# Patient Record
Sex: Female | Born: 1980 | Hispanic: No | Marital: Married | Smoking: Never smoker
Health system: Southern US, Community
[De-identification: ages and names within clinical notes are randomized; demographics above are authoritative.]

## PROBLEM LIST (undated history)

## (undated) ENCOUNTER — Inpatient Hospital Stay (HOSPITAL_COMMUNITY): Payer: Self-pay

---

## 2013-05-29 ENCOUNTER — Encounter (HOSPITAL_COMMUNITY): Payer: Self-pay | Admitting: *Deleted

## 2013-05-29 ENCOUNTER — Inpatient Hospital Stay (HOSPITAL_COMMUNITY)
Admission: AD | Admit: 2013-05-29 | Discharge: 2013-05-29 | Disposition: A | Discharge status: Home / Self Care | Payer: Self-pay | Source: Ambulatory Visit | Attending: Obstetrics & Gynecology | Admitting: Obstetrics & Gynecology

## 2013-05-29 DIAGNOSIS — R42 Dizziness and giddiness: Secondary | ICD-10-CM | POA: Insufficient documentation

## 2013-05-29 DIAGNOSIS — R002 Palpitations: Secondary | ICD-10-CM | POA: Insufficient documentation

## 2013-05-29 DIAGNOSIS — H538 Other visual disturbances: Secondary | ICD-10-CM | POA: Insufficient documentation

## 2013-05-29 DIAGNOSIS — O99891 Other specified diseases and conditions complicating pregnancy: Secondary | ICD-10-CM | POA: Insufficient documentation

## 2013-05-29 LAB — DIFFERENTIAL
Lymphs Abs: 3 10*3/uL (ref 0.7–4.0)
Monocytes Relative: 8 % (ref 3–12)
Neutro Abs: 5.6 10*3/uL (ref 1.7–7.7)
Neutrophils Relative %: 57 % (ref 43–77)

## 2013-05-29 LAB — CBC
Hemoglobin: 12.5 g/dL (ref 12.0–15.0)
MCHC: 33.8 g/dL (ref 30.0–36.0)
MCV: 85.6 fL (ref 78.0–100.0)
RBC: 4.32 MIL/uL (ref 3.87–5.11)

## 2013-05-29 LAB — TYPE AND SCREEN: Antibody Screen: NEGATIVE

## 2013-05-29 MED ORDER — PRENATAL VITAMINS 0.8 MG PO TABS: 1.0000 | ORAL_TABLET | Freq: Every day | ORAL | Status: DC

## 2013-05-29 NOTE — MAU Note (Signed)
Patient brought in by her brother with complaints of dizziness around 2 pm. Reports feeling good now. Has not received any prenatal care this pregnancy. Has been in  for a week. Was in [place] for a month and prior to that in [place].

## 2013-05-29 NOTE — MAU Provider Note (Signed)
  History    CSN: [id number] Arrival date and time: 05/29/13 2016  Chief Complaint  Patient presents with  . Dizziness   HPI Cynthia Decker is a 32 y.o. G3P2002 at [redacted]w[redacted]d by LMP brought in by her brother complaining of dizziness.   The episode lasted < 1 minute earlier this afternoon while standing. She had been standing for about 2 minutes and was talking with a group of people. It was associated with blurry vision and palpitations. She did not fall or lose consciousness. She denies any other episodes before or since. She has had a normal diet recently with adequate fluids, has no sick contacts, denies chronic medical conditions and takes no medications.   Denies fever, chills, HA, vision changes, abdominal pain, Cp/SOB, N/V/D/C, muscle aches, dysuria, urinary frequency, blood in urine or stool, and swelling. No VB, LOF, regular contractions. + GFM.   She is from [place] and speaks only [place], moved to [place] for about 1 month and moved to [place] to live with a friend about 1 week ago. She has received no prenatal care and denies any problems with her previous 2 pregnancies. She will remain in [place] for the duration of this pregnancy and is amenable to establishing care here.    OB History   Grav Para Term Preterm Abortions TAB SAB Ect Mult Living   3 2 2       2      History  Substance Use Topics  . Smoking status: Never Smoker   . Smokeless tobacco: Never Used  . Alcohol Use: No   Allergies: No Known Allergies  No prescriptions prior to admission   ROS As mentioned in HPI, otherwise full ROS is negative. Physical Exam   Blood pressure 105/65, pulse 97, temperature 98.8 F (37.1 C), temperature source Oral, resp. rate 20, height 5\' 3"  (1.6 m), weight 75.751 kg (167 lb), last menstrual period 09/24/2012, SpO2 100.00%.  Physical Exam  Constitutional: She is oriented to person, place, and time. She appears well-developed and well-nourished. No distress.  HENT:  Head:  Normocephalic and atraumatic.  Eyes: Conjunctivae and EOM are normal. No scleral icterus.  Neck: Normal range of motion. Neck supple.  Cardiovascular: Normal heart sounds and intact distal pulses.  Exam reveals no gallop.   No murmur heard. Respiratory: Effort normal and breath sounds normal. No respiratory distress. She has no rales.  GI: Soft. Bowel sounds are normal. There is no tenderness.  Musculoskeletal: Normal range of motion. She exhibits no edema.  Neurological: She is alert and oriented to person, place, and time. She has normal reflexes.   FHT:  Baseline 150, moderate variability, accelerations present, no decelerations Toco: No contractions    05/29/2013 21:40  WBC 9.8  RBC 4.32  Hemoglobin 12.5  HCT 37.0  MCV 85.6  MCH 28.9  MCHC 33.8  RDW 13.7  Platelets 150   MAU Course  Procedures  MDM Brief episode of dizziness without syncope. Exam not indicative of ongoing cardiac etiology. Neuro exam normal. No anemia on CBC. FHT reactive, category I.   Assessment and Plan  Cynthia Decker is a 32 y.o. G3P2002 at [redacted]w[redacted]d with an isolated episode of dizziness without syncope.  - Discharge home - To establish care at [facility], prenatal labs drawn - Rx prenatal vitamin  Cynthia Decker 05/29/2013, 9:39 PM

## 2013-05-30 ENCOUNTER — Other Ambulatory Visit: Payer: Self-pay | Admitting: Family

## 2013-05-30 DIAGNOSIS — O0930 Supervision of pregnancy with insufficient antenatal care, unspecified trimester: Secondary | ICD-10-CM

## 2013-05-30 LAB — RPR: RPR Ser Ql: NONREACTIVE

## 2013-05-30 NOTE — MAU Provider Note (Signed)
I examined pt and agree with documentation above and resident plan of care. MUHAMMAD,Dacotah Cabello  

## 2013-05-31 LAB — RUBELLA SCREEN: Rubella: 18 Index — ABNORMAL HIGH (ref <0.90)

## 2013-06-08 ENCOUNTER — Ambulatory Visit (INDEPENDENT_AMBULATORY_CARE_PROVIDER_SITE_OTHER): Payer: Self-pay | Admitting: Advanced Practice Midwife

## 2013-06-08 ENCOUNTER — Encounter: Payer: Self-pay | Admitting: Advanced Practice Midwife

## 2013-06-08 VITALS — BP 110/70 | Temp 98.7°F | Wt 164.5 lb

## 2013-06-08 DIAGNOSIS — Z609 Problem related to social environment, unspecified: Secondary | ICD-10-CM

## 2013-06-08 DIAGNOSIS — Z603 Acculturation difficulty: Secondary | ICD-10-CM | POA: Insufficient documentation

## 2013-06-08 DIAGNOSIS — O0930 Supervision of pregnancy with insufficient antenatal care, unspecified trimester: Secondary | ICD-10-CM

## 2013-06-08 DIAGNOSIS — O0933 Supervision of pregnancy with insufficient antenatal care, third trimester: Secondary | ICD-10-CM | POA: Insufficient documentation

## 2013-06-08 LAB — POCT URINALYSIS DIP (DEVICE)
Bilirubin Urine: NEGATIVE
Glucose, UA: NEGATIVE mg/dL
Hgb urine dipstick: NEGATIVE
Nitrite: NEGATIVE
Protein, ur: NEGATIVE mg/dL
Urobilinogen, UA: 0.2 mg/dL (ref 0.0–1.0)

## 2013-06-08 LAB — OB RESULTS CONSOLE GC/CHLAMYDIA
Chlamydia: NEGATIVE
Gonorrhea: NEGATIVE

## 2013-06-08 LAB — GLUCOSE TOLERANCE, 1 HOUR (50G) W/O FASTING: Glucose, 1 Hour GTT: 123 mg/dL (ref 70–140)

## 2013-06-08 NOTE — Progress Notes (Signed)
New OB. See other note  Subjective:    Cynthia Decker is a [id number] [redacted]w[redacted]d being seen today for her first obstetrical visit.  Her obstetrical history is significant for Late to care at 36.5wks, initally refused all testing due to cost, language barrier [place]). Patient does intend to breast feed. Pregnancy history fully reviewed.  Patient reports no complaints.  Filed Vitals:   06/08/13 0832  BP: 110/70  Temp: 98.7 F (37.1 C)  Weight: 164 lb 8 oz (74.617 kg)    HISTORY: OB History  Gravida Para Term Preterm AB SAB TAB Ectopic Multiple Living  3 2 2       2     # Outcome Date GA Lbr Len/2nd Weight Sex Delivery Anes PTL Lv  3 CUR           2 TRM 10/22/11 [redacted]w[redacted]d  6 lb 3 oz (2.807 kg) M SVD  N Y  1 TRM 04/2008 [redacted]w[redacted]d  6 lb 1 oz (2.75 kg) M SVD  N Y     Comments: Induced for Low Fluid     History reviewed. No pertinent past medical history. History reviewed. No pertinent past surgical history. History reviewed. No pertinent family history.   Exam    Uterus:  Fundal Height: 36 cm  Pelvic Exam:    Perineum: No Hemorrhoids   Vulva: Bartholin's, Urethra, Skene's normal   Vagina:  normal mucosa, normal discharge   pH:    Cervix: multiparous appearance   Adnexa: no mass, fullness, tenderness   Bony Pelvis: gynecoid  System: Breast:  normal appearance, no masses or tenderness   Skin: normal coloration and turgor, no rashes    Neurologic: oriented, grossly non-focal   Extremities: normal strength, tone, and muscle mass   HEENT neck supple with midline trachea   Mouth/Teeth mucous membranes moist, pharynx normal without lesions   Neck supple and no masses   Cardiovascular: regular rate and rhythm   Respiratory:  appears well, vitals normal, no respiratory distress, acyanotic, normal RR, ear and throat exam is normal, neck free of mass or lymphadenopathy, chest clear, no wheezing, crepitations, rhonchi, normal symmetric air entry   Abdomen: soft, non-tender; bowel sounds normal; no  masses,  no organomegaly   Urinary: urethral meatus normal      Assessment:    Pregnancy: [id number] Patient Active Problem List   Diagnosis Date Noted  . Late prenatal care complicating pregnancy in third trimester 06/08/2013        Plan:     Initial labs drawn. Prenatal vitamins. Problem list reviewed and updated. Genetic Screening discussed Quad Screen: Too Late.  Ultrasound discussed; fetal survey: declined.  Follow up in 1 weeks. 50% of 30 min visit spent on counseling and coordination of care.   Initially refused all testing due to cost. Only wants it if [employer] will pay for it. Came from [place] to [place] then here "for work".  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 States did not go through Immigration, had no vaccines or testing. Did have some blood testing in MAU. As she was leaving, stated she wanted to do the blood tests and vaginal cultures.  These were done after visit.    [facility] 06/08/2013

## 2013-06-08 NOTE — Patient Instructions (Signed)

## 2013-06-08 NOTE — Progress Notes (Signed)
Pulse: 106 Patient has had no prenatal care and has not had an ultrasound yet, based on her lmp she is [redacted]w[redacted]d. States that she didn't get prental care earlier because she was not stable.  She has adopt a mom and is concerned about cost and what the program covers.

## 2013-06-09 LAB — PRESCRIPTION MONITORING PROFILE (19 PANEL)
Amphetamine/Meth: NEGATIVE ng/mL
Barbiturate Screen, Urine: NEGATIVE ng/mL
Benzodiazepine Screen, Urine: NEGATIVE ng/mL
Buprenorphine, Urine: NEGATIVE ng/mL
Cannabinoid Scrn, Ur: NEGATIVE ng/mL
Carisoprodol, Urine: NEGATIVE ng/mL
Methadone Screen, Urine: NEGATIVE ng/mL
Methaqualone: NEGATIVE ng/mL
Opiate Screen, Urine: NEGATIVE ng/mL
Phencyclidine, Ur: NEGATIVE ng/mL
Tapentadol, urine: NEGATIVE ng/mL
Zolpidem, Urine: NEGATIVE ng/mL
pH, Initial: 6.9 pH (ref 4.5–8.9)

## 2013-06-09 LAB — CULTURE, BETA STREP (GROUP B ONLY)

## 2013-06-10 LAB — HEMOGLOBINOPATHY EVALUATION
Hemoglobin Other: 0 %
Hgb A2 Quant: 2.7 % (ref 2.2–3.2)
Hgb A: 96.1 % — ABNORMAL LOW (ref 96.8–97.8)
Hgb S Quant: 0 %

## 2013-06-11 LAB — CULTURE, OB URINE: Colony Count: >=100000

## 2013-06-11 LAB — GC/CHLAMYDIA PROBE AMP: GC Probe RNA: NEGATIVE

## 2013-06-15 ENCOUNTER — Ambulatory Visit (INDEPENDENT_AMBULATORY_CARE_PROVIDER_SITE_OTHER): Payer: Self-pay | Admitting: Family

## 2013-06-15 VITALS — BP 107/67 | Wt 164.9 lb

## 2013-06-15 DIAGNOSIS — O0933 Supervision of pregnancy with insufficient antenatal care, third trimester: Secondary | ICD-10-CM

## 2013-06-15 DIAGNOSIS — Z3490 Encounter for supervision of normal pregnancy, unspecified, unspecified trimester: Secondary | ICD-10-CM

## 2013-06-15 DIAGNOSIS — O0930 Supervision of pregnancy with insufficient antenatal care, unspecified trimester: Secondary | ICD-10-CM

## 2013-06-15 LAB — POCT URINALYSIS DIP (DEVICE)
Bilirubin Urine: NEGATIVE
Glucose, UA: NEGATIVE mg/dL
Hgb urine dipstick: NEGATIVE
Ketones, ur: NEGATIVE mg/dL
Specific Gravity, Urine: 1.02 (ref 1.005–1.030)
Urobilinogen, UA: 1 mg/dL (ref 0.0–1.0)

## 2013-06-15 LAB — OB RESULTS CONSOLE GBS: STREP GROUP B AG: NEGATIVE

## 2013-06-15 NOTE — Progress Notes (Signed)
Pulse:109 

## 2013-06-15 NOTE — Progress Notes (Signed)
Reviewed OB lab results; GBS collected.

## 2013-06-20 ENCOUNTER — Encounter: Payer: Self-pay | Admitting: Family

## 2013-06-21 ENCOUNTER — Ambulatory Visit (HOSPITAL_COMMUNITY)
Admission: RE | Admit: 2013-06-21 | Discharge: 2013-06-21 | Disposition: A | Discharge status: Home / Self Care | Payer: Self-pay | Source: Ambulatory Visit | Attending: Family | Admitting: Family

## 2013-06-21 DIAGNOSIS — O0930 Supervision of pregnancy with insufficient antenatal care, unspecified trimester: Secondary | ICD-10-CM | POA: Insufficient documentation

## 2013-06-21 DIAGNOSIS — Z3689 Encounter for other specified antenatal screening: Secondary | ICD-10-CM | POA: Insufficient documentation

## 2013-06-21 DIAGNOSIS — Z3490 Encounter for supervision of normal pregnancy, unspecified, unspecified trimester: Secondary | ICD-10-CM

## 2013-06-22 ENCOUNTER — Ambulatory Visit (INDEPENDENT_AMBULATORY_CARE_PROVIDER_SITE_OTHER): Payer: Self-pay | Admitting: Obstetrics & Gynecology

## 2013-06-22 ENCOUNTER — Encounter: Payer: Self-pay | Admitting: Obstetrics & Gynecology

## 2013-06-22 VITALS — BP 124/77 | Temp 97.5°F | Wt 166.2 lb

## 2013-06-22 DIAGNOSIS — Z609 Problem related to social environment, unspecified: Secondary | ICD-10-CM

## 2013-06-22 DIAGNOSIS — Z603 Acculturation difficulty: Secondary | ICD-10-CM

## 2013-06-22 DIAGNOSIS — O0930 Supervision of pregnancy with insufficient antenatal care, unspecified trimester: Secondary | ICD-10-CM

## 2013-06-22 DIAGNOSIS — O0933 Supervision of pregnancy with insufficient antenatal care, third trimester: Secondary | ICD-10-CM

## 2013-06-22 LAB — POCT URINALYSIS DIP (DEVICE)
Bilirubin Urine: NEGATIVE
Hgb urine dipstick: NEGATIVE
Ketones, ur: 40 mg/dL — AB
Nitrite: NEGATIVE
Protein, ur: NEGATIVE mg/dL
Specific Gravity, Urine: 1.025 (ref 1.005–1.030)
pH: 6.5 (ref 5.0–8.0)

## 2013-06-22 NOTE — Progress Notes (Signed)
Reviewed neg GBS Reviewed sono from 12/30 Labor precautions reviewed

## 2013-06-22 NOTE — Patient Instructions (Signed)
Breastfeeding Deciding to breastfeed is one of the best choices you can make for you and your baby. A change in hormones during pregnancy causes your breast tissue to grow and increases the number and size of your milk ducts. These hormones also allow proteins, sugars, and fats from your blood supply to make breast milk in your milk-producing glands. Hormones prevent breast milk from being released before your baby is born as well as prompt milk flow after birth. Once breastfeeding has begun, thoughts of your baby, as well as his or her sucking or crying, can stimulate the release of milk from your milk-producing glands.  BENEFITS OF BREASTFEEDING For Your Baby  Your first milk (colostrum) helps your baby's digestive system function better.   There are antibodies in your milk that help your baby fight off infections.   Your baby has a lower incidence of asthma, allergies, and sudden infant death syndrome.   The nutrients in breast milk are better for your baby than infant formulas and are designed uniquely for your baby's needs.   Breast milk improves your baby's brain development.   Your baby is less likely to develop other conditions, such as childhood obesity, asthma, or type 2 diabetes mellitus.  For You   Breastfeeding helps to create a very special bond between you and your baby.   Breastfeeding is convenient. Breast milk is always available at the correct temperature and costs nothing.   Breastfeeding helps to burn calories and helps you lose the weight gained during pregnancy.   Breastfeeding makes your uterus contract to its prepregnancy size faster and slows bleeding (lochia) after you give birth.   Breastfeeding helps to lower your risk of developing type 2 diabetes mellitus, osteoporosis, and breast or ovarian cancer later in life. SIGNS THAT YOUR BABY IS HUNGRY Early Signs of Hunger  Increased alertness or activity.  Stretching.  Movement of the head from  side to side.  Movement of the head and opening of the mouth when the corner of the mouth or cheek is stroked (rooting).  Increased sucking sounds, smacking lips, cooing, sighing, or squeaking.  Hand-to-mouth movements.  Increased sucking of fingers or hands. Late Signs of Hunger  Fussing.  Intermittent crying. Extreme Signs of Hunger Signs of extreme hunger will require calming and consoling before your baby will be able to breastfeed successfully. Do not wait for the following signs of extreme hunger to occur before you initiate breastfeeding:   Restlessness.  A loud, strong cry.   Screaming. BREASTFEEDING BASICS Breastfeeding Initiation  Find a comfortable place to sit or lie down, with your neck and back well supported.  Place a pillow or rolled up blanket under your baby to bring him or her to the level of your breast (if you are seated). Nursing pillows are specially designed to help support your arms and your baby while you breastfeed.  Make sure that your baby's abdomen is facing your abdomen.   Gently massage your breast. With your fingertips, massage from your chest wall toward your nipple in a circular motion. This encourages milk flow. You may need to continue this action during the feeding if your milk flows slowly.  Support your breast with 4 fingers underneath and your thumb above your nipple. Make sure your fingers are well away from your nipple and your baby's mouth.   Stroke your baby's lips gently with your finger or nipple.   When your baby's mouth is open wide enough, quickly bring your baby to your   breast, placing your entire nipple and as much of the colored area around your nipple (areola) as possible into your baby's mouth.   More areola should be visible above your baby's upper lip than below the lower lip.   Your baby's tongue should be between his or her lower gum and your breast.   Ensure that your baby's mouth is correctly positioned  around your nipple (latched). Your baby's lips should create a seal on your breast and be turned out (everted).  It is common for your baby to suck about 2 3 minutes in order to start the flow of breast milk. Latching Teaching your baby how to latch on to your breast properly is very important. An improper latch can cause nipple pain and decreased milk supply for you and poor weight gain in your baby. Also, if your baby is not latched onto your nipple properly, he or she may swallow some air during feeding. This can make your baby fussy. Burping your baby when you switch breasts during the feeding can help to get rid of the air. However, teaching your baby to latch on properly is still the best way to prevent fussiness from swallowing air while breastfeeding. Signs that your baby has successfully latched on to your nipple:    Silent tugging or silent sucking, without causing you pain.   Swallowing heard between every 3 4 sucks.    Muscle movement above and in front of his or her ears while sucking.  Signs that your baby has not successfully latched on to nipple:   Sucking sounds or smacking sounds from your baby while breastfeeding.  Nipple pain. If you think your baby has not latched on correctly, slip your finger into the corner of your baby's mouth to break the suction and place it between your baby's gums. Attempt breastfeeding initiation again. Signs of Successful Breastfeeding Signs from your baby:   A gradual decrease in the number of sucks or complete cessation of sucking.   Falling asleep.   Relaxation of his or her body.   Retention of a small amount of milk in his or her mouth.   Letting go of your breast by himself or herself. Signs from you:  Breasts that have increased in firmness, weight, and size 1 3 hours after feeding.   Breasts that are softer immediately after breastfeeding.  Increased milk volume, as well as a change in milk consistency and color by  the 5th day of breastfeeding.   Nipples that are not sore, cracked, or bleeding. Signs That Your Baby is Getting Enough Milk  Wetting at least 3 diapers in a 24-hour period. The urine should be clear and pale yellow by age 5 days.  At least 3 stools in a 24-hour period by age 5 days. The stool should be soft and yellow.  At least 3 stools in a 24-hour period by age 7 days. The stool should be seedy and yellow.  No loss of weight greater than 10% of birth weight during the first 3 days of age.  Average weight gain of 4 7 ounces (120 210 mL) per week after age 4 days.  Consistent daily weight gain by age 5 days, without weight loss after the age of 2 weeks. After a feeding, your baby may spit up a small amount. This is common. BREASTFEEDING FREQUENCY AND DURATION Frequent feeding will help you make more milk and can prevent sore nipples and breast engorgement. Breastfeed when you feel the need to reduce   the fullness of your breasts or when your baby shows signs of hunger. This is called "breastfeeding on demand." Avoid introducing a pacifier to your baby while you are working to establish breastfeeding (the first 4 6 weeks after your baby is born). After this time you may choose to use a pacifier. Research has shown that pacifier use during the first year of a baby's life decreases the risk of sudden infant death syndrome (SIDS). Allow your baby to feed on each breast as long as he or she wants. Breastfeed until your baby is finished feeding. When your baby unlatches or falls asleep while feeding from the first breast, offer the second breast. Because newborns are often sleepy in the first few weeks of life, you may need to awaken your baby to get him or her to feed. Breastfeeding times will vary from baby to baby. However, the following rules can serve as a guide to help you ensure that your baby is properly fed:  Newborns (babies 4 weeks of age or younger) may breastfeed every 1 3  hours.  Newborns should not go longer than 3 hours during the day or 5 hours during the night without breastfeeding.  You should breastfeed your baby a minimum of 8 times in a 24-hour period until you begin to introduce solid foods to your baby at around 6 months of age. BREAST MILK PUMPING Pumping and storing breast milk allows you to ensure that your baby is exclusively fed your breast milk, even at times when you are unable to breastfeed. This is especially important if you are going back to work while you are still breastfeeding or when you are not able to be present during feedings. Your lactation consultant can give you guidelines on how long it is safe to store breast milk.  A breast pump is a machine that allows you to pump milk from your breast into a sterile bottle. The pumped breast milk can then be stored in a refrigerator or freezer. Some breast pumps are operated by hand, while others use electricity. Ask your lactation consultant which type will work best for you. Breast pumps can be purchased, but some hospitals and breastfeeding support groups lease breast pumps on a monthly basis. A lactation consultant can teach you how to hand express breast milk, if you prefer not to use a pump.  CARING FOR YOUR BREASTS WHILE YOU BREASTFEED Nipples can become dry, cracked, and sore while breastfeeding. The following recommendations can help keep your breasts moisturized and healthy:  Avoid using soap on your nipples.   Wear a supportive bra. Although not required, special nursing bras and tank tops are designed to allow access to your breasts for breastfeeding without taking off your entire bra or top. Avoid wearing underwire style bras or extremely tight bras.  Air dry your nipples for 3 4minutes after each feeding.   Use only cotton bra pads to absorb leaked breast milk. Leaking of breast milk between feedings is normal.   Use lanolin on your nipples after breastfeeding. Lanolin helps to  maintain your skin's normal moisture barrier. If you use pure lanolin you do not need to wash it off before feeding your baby again. Pure lanolin is not toxic to your baby. You may also hand express a few drops of breast milk and gently massage that milk into your nipples and allow the milk to air dry. In the first few weeks after giving birth, some women experience extremely full breasts (engorgement). Engorgement can make   your breasts feel heavy, warm, and tender to the touch. Engorgement peaks within 3 5 days after you give birth. The following recommendations can help ease engorgement:  Completely empty your breasts while breastfeeding or pumping. You may want to start by applying warm, moist heat (in the shower or with warm water-soaked hand towels) just before feeding or pumping. This increases circulation and helps the milk flow. If your baby does not completely empty your breasts while breastfeeding, pump any extra milk after he or she is finished.  Wear a snug bra (nursing or regular) or tank top for 1 2 days to signal your body to slightly decrease milk production.  Apply ice packs to your breasts, unless this is too uncomfortable for you.  Make sure that your baby is latched on and positioned properly while breastfeeding. If engorgement persists after 48 hours of following these recommendations, contact your health care provider or a lactation consultant. OVERALL HEALTH CARE RECOMMENDATIONS WHILE BREASTFEEDING  Eat healthy foods. Alternate between meals and snacks, eating 3 of each per day. Because what you eat affects your breast milk, some of the foods may make your baby more irritable than usual. Avoid eating these foods if you are sure that they are negatively affecting your baby.  Drink milk, fruit juice, and water to satisfy your thirst (about 10 glasses a day).   Rest often, relax, and continue to take your prenatal vitamins to prevent fatigue, stress, and anemia.  Continue  breast self-awareness checks.  Avoid chewing and smoking tobacco.  Avoid alcohol and drug use. Some medicines that may be harmful to your baby can pass through breast milk. It is important to ask your health care provider before taking any medicine, including all over-the-counter and prescription medicine as well as vitamin and herbal supplements. It is possible to become pregnant while breastfeeding. If birth control is desired, ask your health care provider about options that will be safe for your baby. SEEK MEDICAL CARE IF:   You feel like you want to stop breastfeeding or have become frustrated with breastfeeding.  You have painful breasts or nipples.  Your nipples are cracked or bleeding.  Your breasts are red, tender, or warm.  You have a swollen area on either breast.  You have a fever or chills.  You have nausea or vomiting.  You have drainage other than breast milk from your nipples.  Your breasts do not become full before feedings by the 5th day after you give birth.  You feel sad and depressed.  Your baby is too sleepy to eat well.  Your baby is having trouble sleeping.   Your baby is wetting less than 3 diapers in a 24-hour period.  Your baby has less than 3 stools in a 24-hour period.  Your baby's skin or the white part of his or her eyes becomes yellow.   Your baby is not gaining weight by 5 days of age. SEEK IMMEDIATE MEDICAL CARE IF:   Your baby is overly tired (lethargic) and does not want to wake up and feed.  Your baby develops an unexplained fever. Document Released: 06/09/2005 Document Revised: 02/09/2013 Document Reviewed: 12/01/2012 Exit[employer] Patient Information 2014 [employer].  

## 2013-06-22 NOTE — Progress Notes (Signed)
Pulse: 103

## 2013-06-23 ENCOUNTER — Encounter: Payer: Self-pay | Admitting: Family

## 2013-06-23 NOTE — L&D Delivery Note (Signed)
Attestation of Attending Supervision of Advanced Practitioner (PA/CNM/NP): Evaluation and management procedures were performed by the Advanced Practitioner under my supervision and collaboration.  I have reviewed the Advanced Practitioner's note and chart, and I agree with the management and plan.  Kareemah Grounds, MD, FACOG Attending Obstetrician & Gynecologist Faculty Practice, [facility]  

## 2013-06-23 NOTE — L&D Delivery Note (Signed)
Delivery Note Pt pushed for a short time and at 1:29 AM a viable female was delivered via Vaginal, Spontaneous Delivery (Presentation: Occiput Anterior).  Infant born with amniotic sac intact- en caul. Membrane pulled away from face immediately after birth.  APGAR: 8, 9; weight: 6+12.8. Infant dried and placed on pt's abd; cord clamped and cut by mother of pt. Hospital cord blood sample collected.  Placenta status: intact, spontaneous.  Cord: 3 vessels with the following complications: None.  Cord pH: not sent  Anesthesia: Epidural  Episiotomy: None Lacerations: None Suture Repair: None Est. Blood Loss (mL): 200  Mom to postpartum.  Baby to Couplet care / Skin to Skin.  Cynthia Decker, Cynthia Decker 07/09/2013, 1:44 AM  I have seen and examined this patient and I agree with the above. SHAW, KIMBERLY 3:09 AM 07/09/2013

## 2013-06-28 ENCOUNTER — Ambulatory Visit (INDEPENDENT_AMBULATORY_CARE_PROVIDER_SITE_OTHER): Payer: Self-pay | Admitting: Advanced Practice Midwife

## 2013-06-28 VITALS — BP 107/71 | Temp 97.8°F | Wt 166.8 lb

## 2013-06-28 DIAGNOSIS — O0933 Supervision of pregnancy with insufficient antenatal care, third trimester: Secondary | ICD-10-CM

## 2013-06-28 DIAGNOSIS — Z23 Encounter for immunization: Secondary | ICD-10-CM

## 2013-06-28 DIAGNOSIS — O0930 Supervision of pregnancy with insufficient antenatal care, unspecified trimester: Secondary | ICD-10-CM

## 2013-06-28 LAB — POCT URINALYSIS DIP (DEVICE)
BILIRUBIN URINE: NEGATIVE
Glucose, UA: NEGATIVE mg/dL
Ketones, ur: NEGATIVE mg/dL
Leukocytes, UA: NEGATIVE
Nitrite: NEGATIVE
Protein, ur: NEGATIVE mg/dL
Specific Gravity, Urine: 1.025 (ref 1.005–1.030)
UROBILINOGEN UA: 1 mg/dL (ref 0.0–1.0)
pH: 6 (ref 5.0–8.0)

## 2013-06-28 MED ORDER — TETANUS-DIPHTH-ACELL PERTUSSIS 5-2.5-18.5 LF-MCG/0.5 IM SUSP: 0.5000 mL | Freq: Once | INTRAMUSCULAR | Status: DC

## 2013-06-28 NOTE — Progress Notes (Signed)
P=101  [employer] tradernterpreter,

## 2013-06-28 NOTE — Progress Notes (Signed)
Doing well.  Good fetal movement, denies vaginal bleeding, LOF, regular contractions.  Intermittent cramping keeping her awake at night.  Desires cervical exam.  Membranes swept at pt request.  Pt tolerated well.

## 2013-07-06 ENCOUNTER — Ambulatory Visit (INDEPENDENT_AMBULATORY_CARE_PROVIDER_SITE_OTHER): Payer: Self-pay | Admitting: Family

## 2013-07-06 ENCOUNTER — Encounter: Payer: Self-pay | Admitting: Family

## 2013-07-06 VITALS — BP 121/72 | Wt 168.2 lb

## 2013-07-06 DIAGNOSIS — O0930 Supervision of pregnancy with insufficient antenatal care, unspecified trimester: Secondary | ICD-10-CM

## 2013-07-06 DIAGNOSIS — O480 Post-term pregnancy: Secondary | ICD-10-CM

## 2013-07-06 DIAGNOSIS — O0933 Supervision of pregnancy with insufficient antenatal care, third trimester: Secondary | ICD-10-CM

## 2013-07-06 LAB — POCT URINALYSIS DIP (DEVICE)
Bilirubin Urine: NEGATIVE
Glucose, UA: NEGATIVE mg/dL
KETONES UR: NEGATIVE mg/dL
Nitrite: NEGATIVE
Protein, ur: NEGATIVE mg/dL
Specific Gravity, Urine: >=1.03 (ref 1.005–1.030)
Urobilinogen, UA: 1 mg/dL (ref 0.0–1.0)
pH: 6.5 (ref 5.0–8.0)

## 2013-07-06 LAB — US OB FOLLOW UP

## 2013-07-06 NOTE — Progress Notes (Signed)
Pulse 104 Patient states that she is having a lot of pain and contractions, also can't sleep.

## 2013-07-06 NOTE — Progress Notes (Signed)
I examined pt and agree with documentation above and nurse midwife student plan of care.  

## 2013-07-06 NOTE — Progress Notes (Signed)
Pt reports constant lower back pain and pelvic pressure. +FM, Denies LOF, VB. Schedule IOL

## 2013-07-07 ENCOUNTER — Telehealth (HOSPITAL_COMMUNITY): Payer: Self-pay | Admitting: *Deleted

## 2013-07-07 NOTE — Telephone Encounter (Signed)
Preadmission screen Interpreter number [phone]

## 2013-07-08 ENCOUNTER — Encounter (HOSPITAL_COMMUNITY): Payer: Self-pay

## 2013-07-08 ENCOUNTER — Encounter: Payer: Self-pay | Admitting: Advanced Practice Midwife

## 2013-07-08 ENCOUNTER — Inpatient Hospital Stay (HOSPITAL_COMMUNITY)
Admission: RE | Admit: 2013-07-08 | Discharge: 2013-07-10 | DRG: 775 | Disposition: A | Discharge status: Home / Self Care | Payer: Self-pay | Source: Ambulatory Visit | Attending: Obstetrics & Gynecology | Admitting: Obstetrics & Gynecology

## 2013-07-08 VITALS — BP 98/49 | HR 81 | Temp 98.3°F | Resp 17 | Ht 63.0 in | Wt 166.0 lb

## 2013-07-08 DIAGNOSIS — O480 Post-term pregnancy: Secondary | ICD-10-CM

## 2013-07-08 DIAGNOSIS — O0933 Supervision of pregnancy with insufficient antenatal care, third trimester: Secondary | ICD-10-CM

## 2013-07-08 DIAGNOSIS — IMO0001 Reserved for inherently not codable concepts without codable children: Secondary | ICD-10-CM

## 2013-07-08 LAB — CBC
HCT: 37.8 % (ref 36.0–46.0)
Hemoglobin: 12.7 g/dL (ref 12.0–15.0)
MCH: 29.2 pg (ref 26.0–34.0)
MCHC: 33.6 g/dL (ref 30.0–36.0)
MCV: 86.9 fL (ref 78.0–100.0)
PLATELETS: 152 10*3/uL (ref 150–400)
RBC: 4.35 MIL/uL (ref 3.87–5.11)
RDW: 14.7 % (ref 11.5–15.5)
WBC: 8.4 10*3/uL (ref 4.0–10.5)

## 2013-07-08 LAB — RPR: RPR Ser Ql: NONREACTIVE

## 2013-07-08 MED ORDER — EPHEDRINE 5 MG/ML INJ
10.0000 mg | INTRAVENOUS | Status: DC | PRN
  Filled 2013-07-08: qty 2

## 2013-07-08 MED ORDER — OXYTOCIN BOLUS FROM INFUSION
500.0000 mL | INTRAVENOUS | Status: DC
  Administered 2013-07-09: 500 mL via INTRAVENOUS

## 2013-07-08 MED ORDER — IBUPROFEN 600 MG PO TABS
600.0000 mg | ORAL_TABLET | Freq: Four times a day (QID) | ORAL | Status: DC | PRN
  Administered 2013-07-09: 600 mg via ORAL
  Filled 2013-07-08: qty 1

## 2013-07-08 MED ORDER — LACTATED RINGERS IV SOLN: 500.0000 mL | INTRAVENOUS | Status: DC | PRN

## 2013-07-08 MED ORDER — EPHEDRINE 5 MG/ML INJ
10.0000 mg | INTRAVENOUS | Status: DC | PRN
  Filled 2013-07-08: qty 4
  Filled 2013-07-08: qty 2

## 2013-07-08 MED ORDER — LIDOCAINE HCL (PF) 1 % IJ SOLN
30.0000 mL | INTRAMUSCULAR | Status: DC | PRN
  Filled 2013-07-08 (×2): qty 30

## 2013-07-08 MED ORDER — FENTANYL CITRATE 0.05 MG/ML IJ SOLN
100.0000 ug | INTRAMUSCULAR | Status: DC | PRN
  Administered 2013-07-08: 100 ug via INTRAVENOUS
  Filled 2013-07-08: qty 2

## 2013-07-08 MED ORDER — PHENYLEPHRINE 40 MCG/ML (10ML) SYRINGE FOR IV PUSH (FOR BLOOD PRESSURE SUPPORT)
80.0000 ug | PREFILLED_SYRINGE | INTRAVENOUS | Status: DC | PRN
  Filled 2013-07-08: qty 2
  Filled 2013-07-08: qty 10

## 2013-07-08 MED ORDER — FENTANYL 2.5 MCG/ML BUPIVACAINE 1/10 % EPIDURAL INFUSION (WH - ANES)
14.0000 mL/h | INTRAMUSCULAR | Status: DC | PRN
  Administered 2013-07-09: 14 mL/h via EPIDURAL
  Filled 2013-07-08: qty 125

## 2013-07-08 MED ORDER — FLEET ENEMA 7-19 GM/118ML RE ENEM: 1.0000 | ENEMA | RECTAL | Status: DC | PRN

## 2013-07-08 MED ORDER — OXYTOCIN 40 UNITS IN LACTATED RINGERS INFUSION - SIMPLE MED: 1.0000 m[IU]/min | INTRAVENOUS | Status: DC

## 2013-07-08 MED ORDER — OXYTOCIN 40 UNITS IN LACTATED RINGERS INFUSION - SIMPLE MED
62.5000 mL/h | INTRAVENOUS | Status: DC
  Filled 2013-07-08: qty 1000

## 2013-07-08 MED ORDER — DIPHENHYDRAMINE HCL 50 MG/ML IJ SOLN: 12.5000 mg | INTRAMUSCULAR | Status: DC | PRN

## 2013-07-08 MED ORDER — ZOLPIDEM TARTRATE 5 MG PO TABS: 5.0000 mg | ORAL_TABLET | Freq: Every evening | ORAL | Status: DC | PRN

## 2013-07-08 MED ORDER — ACETAMINOPHEN 325 MG PO TABS: 650.0000 mg | ORAL_TABLET | ORAL | Status: DC | PRN

## 2013-07-08 MED ORDER — MISOPROSTOL 25 MCG QUARTER TABLET
25.0000 ug | ORAL_TABLET | ORAL | Status: DC
  Administered 2013-07-08 (×2): 25 ug via VAGINAL
  Filled 2013-07-08 (×2): qty 0.25
  Filled 2013-07-08: qty 1
  Filled 2013-07-08: qty 0.25
  Filled 2013-07-08 (×2): qty 1

## 2013-07-08 MED ORDER — TERBUTALINE SULFATE 1 MG/ML IJ SOLN: 0.2500 mg | Freq: Once | INTRAMUSCULAR | Status: AC | PRN

## 2013-07-08 MED ORDER — ONDANSETRON HCL 4 MG/2ML IJ SOLN: 4.0000 mg | Freq: Four times a day (QID) | INTRAMUSCULAR | Status: DC | PRN

## 2013-07-08 MED ORDER — OXYTOCIN 40 UNITS IN LACTATED RINGERS INFUSION - SIMPLE MED
1.0000 m[IU]/min | INTRAVENOUS | Status: DC
  Administered 2013-07-08: 2 m[IU]/min via INTRAVENOUS
  Filled 2013-07-08: qty 1000

## 2013-07-08 MED ORDER — LACTATED RINGERS IV SOLN
500.0000 mL | Freq: Once | INTRAVENOUS | Status: AC
  Administered 2013-07-09: 500 mL via INTRAVENOUS

## 2013-07-08 MED ORDER — LACTATED RINGERS IV SOLN
INTRAVENOUS | Status: DC
  Administered 2013-07-08 (×3): via INTRAVENOUS

## 2013-07-08 MED ORDER — OXYCODONE-ACETAMINOPHEN 5-325 MG PO TABS: 1.0000 | ORAL_TABLET | ORAL | Status: DC | PRN

## 2013-07-08 MED ORDER — CITRIC ACID-SODIUM CITRATE 334-500 MG/5ML PO SOLN: 30.0000 mL | ORAL | Status: DC | PRN

## 2013-07-08 MED ORDER — PHENYLEPHRINE 40 MCG/ML (10ML) SYRINGE FOR IV PUSH (FOR BLOOD PRESSURE SUPPORT)
80.0000 ug | PREFILLED_SYRINGE | INTRAVENOUS | Status: DC | PRN
  Filled 2013-07-08: qty 2

## 2013-07-08 NOTE — Progress Notes (Signed)
   Subjective: Patient is tired and concerned about slow progression.  Anxious to get labor going.  Objective: BP 98/65  Pulse 94  Temp(Src) 98 F (36.7 C) (Oral)  Resp 18  Ht 5\' 3"  (1.6 m)  Wt 75.297 kg (166 lb)  BMI 29.41 kg/m2  LMP 09/24/2012      FHT:  FHR: 150 bpm, variability: moderate,  accelerations:  Present,  decelerations:  Absent UC:   irregular, every 3-7 minutes SVE:   Dilation: 3.5 Effacement (%): 60 Station: -3 Exam by:: Cynthia Decker, Student CNM  Labs: Lab Results  Component Value Date   WBC 8.4 07/08/2013   HGB 12.7 07/08/2013   HCT 37.8 07/08/2013   MCV 86.9 07/08/2013   PLT 152 07/08/2013    Assessment / Plan: Induction of labor due to postdates.  Labor: Limited Progress, S/P cytotec Preeclampsia:  no signs or symptoms of toxicity Fetal Wellbeing:  Category I Pain Control:  Labor support without medications I/D:  n/a Anticipated MOD:  NSVD  Start pitocin to augment labor.  Cynthia LesserBraimah, Linet Brash 07/08/2013, 11:05 PM

## 2013-07-08 NOTE — H&P (Signed)
Cynthia Decker is a 33 y.o. female G3P2002 with IUP at 3654w0d presenting for IOL for postdates. [facility]  [facility] Past Medical History: No past medical history on file.  Past Surgical History: No past surgical history on file.  Obstetrical History: OB History   Grav Para Term Preterm Abortions TAB SAB Ect Mult Living   3 2 2       2        Social History: History   Social History  . Marital Status: Married    Spouse Name: N/A    Number of Children: N/A  . Years of Education: N/A   Social History Main Topics  . Smoking status: Never Smoker   . Smokeless tobacco: Never Used  . Alcohol Use: No  . Drug Use: No  . Sexual Activity: No   Other Topics Concern  . Not on file   Social History Narrative  . No narrative on file    Family History: No family history on file.  Allergies: No Known Allergies   (Not in a hospital admission)   Review of Systems   Constitutional: Negative for fever and chills Eyes: Negative for visual disturbances Respiratory: Negative for shortness of breath, dyspnea Cardiovascular: Negative for chest pain or palpitations  Gastrointestinal: Negative for vomiting, diarrhea and constipation Genitourinary: Negative for dysuria and urgency Musculoskeletal: Negative for back pain, joint pain, myalgias  Neurological: Negative for dizziness and headaches    Last menstrual period 09/24/2012. General appearance: alert, cooperative and no distress Lungs: clear to auscultation bilaterally Heart: regular rate and rhythm Abdomen: soft, non-tender; bowel sounds normal Extremities: Homans sign is negative, no sign of DVT DTR's 2+ Presentation: cephalic Fetal monitoringBaseline: 140 bpm, Variability: Good {> 6 bpm), Accelerations: Reactive and Decelerations: Absent Uterine activityNone     Prenatal labs: ABO, Rh: --/--/A POS, A POS (12/07 2140) Antibody: NEG (12/07 2140) Rubella:    RPR: NON REACTIVE (12/07 2140)  HBsAg: NEGATIVE (12/07 2140)  HIV: NON REACTIVE (12/17 0951)  GBS: Negative (12/24 0000)  1 hr Glucola 123 Genetic screening  Too late Anatomy US normal   No results found for this or any previous visit (from the past 24 hour(s)).  Assessment: Cynthia Decker is a 33 y.o. [id number] with an IUP at 1254w0d presenting for IOL for postdates  Plan: Pitocin   CRESENZO-DISHMAN,Loreli Debruler 07/08/2013, 7:58 AM

## 2013-07-08 NOTE — Progress Notes (Signed)
Cynthia Decker is a 33 y.o. G3P2002 at 7350w0d admitted for induction of labor due to Post dates.  Subjective: Called to pt room for patient dissatisfaction. Discussed at length plan of care with dr. Despina HiddenEure and husband who speaks english. Reassured plan and pt may file complaint if desired. But patient and husband now on board with plan of care and understand the induction process.  Objective: BP 93/52  Pulse 101  Temp(Src) 98.2 F (36.8 C) (Oral)  Resp 20  Ht 5\' 3"  (1.6 m)  Wt 75.297 kg (166 lb)  BMI 29.41 kg/m2  LMP 09/24/2012      FHT:  FHR: 140 bpm, variability: moderate,  accelerations:  Present,  decelerations:  Absent UC:   irregular, every 4-6 minutes SVE:   Dilation: 3 Effacement (%): 60 Station: -3 Exam by:: Marijean HeathM. Robinson, RN  Labs: Lab Results  Component Value Date   WBC 8.4 07/08/2013   HGB 12.7 07/08/2013   HCT 37.8 07/08/2013   MCV 86.9 07/08/2013   PLT 152 07/08/2013    Assessment / Plan: Induction of labor due to postterm,  progressing well on pitocin  Labor: rec'd 2 doses of cytotec. WIll likely move to pitocin in 4 hrs. Preeclampsia:  no signs or symptoms of toxicity Fetal Wellbeing:  Category I Pain Control:  as needed I/D:  n/a Anticipated MOD:  NSVD  Cynthia Decker 07/08/2013, 7:16 PM

## 2013-07-08 NOTE — Progress Notes (Signed)
At 1815, I discussed plan of care with patient using pacific interpreter for french. I answered patients questions and explained a 2 stage induction. Minutes later when patients family members entered into the discussion, they had concerns about plan of care. I then notified Dr. Ike Benedom and patient's treatment team had discussion regarding plan of care with patient's family Dr. Despina HiddenEure, Dr. Ike Benedom, Belenda CruiseHeather Mitchell, Charge RN, and myself. Patient and family now understand plan.

## 2013-07-08 NOTE — Progress Notes (Signed)
Sonny DandyWande Schermer is a 33 y.o. G3P2002 at 6012w0d admitted for induction of labor due to Post dates. Due date 07/01/2013.  Subjective: Doing well with ctx at this time. On pit  Objective: BP 98/56  Pulse 91  Temp(Src) 98.1 F (36.7 C) (Oral)  Resp 18  Ht 5\' 3"  (1.6 m)  Wt 75.297 kg (166 lb)  BMI 29.41 kg/m2  LMP 09/24/2012      FHT:  FHR: 150 bpm, variability: moderate,  accelerations:  Present,  decelerations:  Absent UC:   irregular, every 2-5 minutes SVE:   Dilation: 3 Effacement (%): 50 Station: -3 Exam by:: Dr. Ike Benedom  Labs: Lab Results  Component Value Date   WBC 8.4 07/08/2013   HGB 12.7 07/08/2013   HCT 37.8 07/08/2013   MCV 86.9 07/08/2013   PLT 152 07/08/2013    Assessment / Plan: Induction of labor due to postterm,  progressing well on pitocin  Labor: minimal change on pit today, will stop and give a dose of cytotec. Preeclampsia:  no signs or symptoms of toxicity Fetal Wellbeing:  Category I Pain Control:  as needed may have IV pain meds or epidural I/D:  n/a Anticipated MOD:  NSVD  Adylee Leonardo RYAN 07/08/2013, 1:10 PM

## 2013-07-09 ENCOUNTER — Inpatient Hospital Stay (HOSPITAL_COMMUNITY): Payer: Self-pay | Admitting: Anesthesiology

## 2013-07-09 ENCOUNTER — Encounter (HOSPITAL_COMMUNITY): Payer: MEDICAID | Admitting: Anesthesiology

## 2013-07-09 ENCOUNTER — Encounter (HOSPITAL_COMMUNITY): Payer: Self-pay

## 2013-07-09 MED ORDER — LANOLIN HYDROUS EX OINT
TOPICAL_OINTMENT | CUTANEOUS | Status: DC | PRN
Start: 2013-07-09 — End: 2013-07-10

## 2013-07-09 MED ORDER — SIMETHICONE 80 MG PO CHEW: 80.0000 mg | CHEWABLE_TABLET | ORAL | Status: DC | PRN

## 2013-07-09 MED ORDER — TETANUS-DIPHTH-ACELL PERTUSSIS 5-2.5-18.5 LF-MCG/0.5 IM SUSP: 0.5000 mL | Freq: Once | INTRAMUSCULAR | Status: DC

## 2013-07-09 MED ORDER — OXYCODONE-ACETAMINOPHEN 5-325 MG PO TABS: 1.0000 | ORAL_TABLET | ORAL | Status: DC | PRN

## 2013-07-09 MED ORDER — PRENATAL MULTIVITAMIN CH
1.0000 | ORAL_TABLET | Freq: Every day | ORAL | Status: DC
  Administered 2013-07-09: 1 via ORAL
  Filled 2013-07-09: qty 1

## 2013-07-09 MED ORDER — DIBUCAINE 1 % RE OINT: 1.0000 "application " | TOPICAL_OINTMENT | RECTAL | Status: DC | PRN

## 2013-07-09 MED ORDER — LIDOCAINE HCL (PF) 1 % IJ SOLN
INTRAMUSCULAR | Status: DC | PRN
  Administered 2013-07-09 (×2): 5 mL

## 2013-07-09 MED ORDER — SENNOSIDES-DOCUSATE SODIUM 8.6-50 MG PO TABS
2.0000 | ORAL_TABLET | ORAL | Status: DC
  Administered 2013-07-09: 2 via ORAL
  Filled 2013-07-09: qty 2

## 2013-07-09 MED ORDER — WITCH HAZEL-GLYCERIN EX PADS: 1.0000 "application " | MEDICATED_PAD | CUTANEOUS | Status: DC | PRN

## 2013-07-09 MED ORDER — BENZOCAINE-MENTHOL 20-0.5 % EX AERO
1.0000 "application " | INHALATION_SPRAY | CUTANEOUS | Status: DC | PRN
  Administered 2013-07-09: 1 via TOPICAL
  Filled 2013-07-09: qty 56

## 2013-07-09 MED ORDER — ONDANSETRON HCL 4 MG PO TABS: 4.0000 mg | ORAL_TABLET | ORAL | Status: DC | PRN

## 2013-07-09 MED ORDER — ONDANSETRON HCL 4 MG/2ML IJ SOLN: 4.0000 mg | INTRAMUSCULAR | Status: DC | PRN

## 2013-07-09 MED ORDER — DIPHENHYDRAMINE HCL 25 MG PO CAPS: 25.0000 mg | ORAL_CAPSULE | Freq: Four times a day (QID) | ORAL | Status: DC | PRN

## 2013-07-09 MED ORDER — IBUPROFEN 600 MG PO TABS
600.0000 mg | ORAL_TABLET | Freq: Four times a day (QID) | ORAL | Status: DC
  Administered 2013-07-09 (×3): 600 mg via ORAL
  Filled 2013-07-09 (×4): qty 1

## 2013-07-09 MED ORDER — ZOLPIDEM TARTRATE 5 MG PO TABS: 5.0000 mg | ORAL_TABLET | Freq: Every evening | ORAL | Status: DC | PRN

## 2013-07-09 NOTE — Anesthesia Preprocedure Evaluation (Signed)

## 2013-07-09 NOTE — Anesthesia Postprocedure Evaluation (Signed)
  Anesthesia Post-op Note  Patient: Cynthia Decker  Procedure(s) Performed: * No procedures listed *  Patient Location: PACU and Mother/Baby  Anesthesia Type:Epidural  Level of Consciousness: awake, alert  and oriented  Airway and Oxygen Therapy: Patient Spontanous Breathing  Post-op Pain: none  Post-op Assessment: Post-op Vital signs reviewed, Patient's Cardiovascular Status Stable, No headache, No backache, No residual numbness and No residual motor weakness  Post-op Vital Signs: Reviewed and stable  Complications: No apparent anesthesia complications

## 2013-07-09 NOTE — Progress Notes (Signed)
Clinical Social Work Department PSYCHOSOCIAL ASSESSMENT - MATERNAL/CHILD 07/09/2013  Patient:  Cynthia Decker,Cynthia Decker  Account Number:  [id number]  Admit Date:  07/08/2013  Childs Name:   Undecided at this time    Clinical Social Worker:  Katalaya Beel, LCSW   Date/Time:  07/09/2013 01:00 PM  Date Referred:  07/09/2013   Referral source  Central Nursery     Referred reason  LPNC   Other referral source:    I:  FAMILY / HOME ENVIRONMENT Child's legal guardian:  PARENT  Guardian - Name Guardian - Age Guardian - Address  Gordy, Gena [address]  [place], [place] [address]   Other household support members/support persons Other support:    II  PSYCHOSOCIAL DATA Information Source:    Financial and Community Resources Employment:   Supported by family   Financial resources:  Medicaid (pregnancy medicaid only)  Mother plans to apply for newborn If Medicaid - County:   Other  WIC (Mother plans to apply)   School / Grade:   Maternity Care Coordinator / Child Services Coordination / Early Interventions:  Cultural issues impacting care:    III  STRENGTHS Strengths  Supportive family/friends  Home prepared for Child (including basic supplies)  Adequate Resources   Strength comment:    IV  RISK FACTORS AND CURRENT PROBLEMS Current Problem:       V  SOCIAL WORK ASSESSMENT Acknowledged order for social work consult due to LPC. Met with mother who was pleasant and receptive to social work intervention.   Maternal grandmother and maternal aunt was present.  Mother requested to have her sister translate instead of using the language line.  Parents are of [place] descent.   Mother states that she has been living in the [place] for about a year.  Her husband and two children ages 4 and 2 are still living in [place].  Mother reports that she received late PNC because she did not have insurance.  She has applied for Medicaid and communicate intent to apply for WIC.     Family seems  knowledgeable about available community resources.    She denies any hx of substance abuse or mental illness.  Discussed the hospital's drug screen policy.  Family informed of social work availability.      VI SOCIAL WORK PLAN  Type of pt/family education:   If child protective services report - county:   If child protective services report - date:   Information/referral to community resources comment:   Other social work plan:   Will continue to follow PRN.     

## 2013-07-09 NOTE — Progress Notes (Signed)
I have seen and examined this patient and I agree with the above. Cynthia Decker 3:10 AM 07/09/2013

## 2013-07-09 NOTE — Anesthesia Procedure Notes (Signed)
Epidural Patient location during procedure: OB Start time: 07/09/2013 1:14 AM  Staffing Anesthesiologist: Brayton CavesJACKSON, Shaketha Jeon Performed by: anesthesiologist   Preanesthetic Checklist Completed: patient identified, site marked, surgical consent, pre-op evaluation, timeout performed, IV checked, risks and benefits discussed and monitors and equipment checked  Epidural Patient position: sitting Prep: site prepped and draped and DuraPrep Patient monitoring: continuous pulse ox and blood pressure Approach: midline Injection technique: LOR air  Needle:  Needle type: Tuohy  Needle gauge: 17 G Needle length: 9 cm and 9 Needle insertion depth: 5 cm cm Catheter type: closed end flexible Catheter size: 19 Gauge Catheter at skin depth: 10 cm Test dose: negative  Assessment Events: blood not aspirated, injection not painful, no injection resistance, negative IV test and no paresthesia  Additional Notes Patient identified.  Risk benefits discussed including failed block, incomplete pain control, headache, nerve damage, paralysis, blood pressure changes, nausea, vomiting, reactions to medication both toxic or allergic, and postpartum back pain.  Patient expressed understanding and wished to proceed.  All questions were answered.  Sterile technique used throughout procedure and epidural site dressed with sterile barrier dressing. No paresthesia or other complications noted.The patient did not experience any signs of intravascular injection such as tinnitus or metallic taste in mouth nor signs of intrathecal spread such as rapid motor block. Please see nursing notes for vital signs.

## 2013-07-10 MED ORDER — IBUPROFEN 800 MG PO TABS: 800.0000 mg | ORAL_TABLET | Freq: Three times a day (TID) | ORAL | Status: AC | PRN

## 2013-07-10 MED ORDER — OXYCODONE-ACETAMINOPHEN 5-325 MG PO TABS: 1.0000 | ORAL_TABLET | Freq: Four times a day (QID) | ORAL | Status: AC | PRN

## 2013-07-10 NOTE — Discharge Instructions (Signed)

## 2013-07-10 NOTE — Discharge Summary (Signed)
Obstetric Discharge Summary Reason for Admission: induction of labor Prenatal Procedures: ultrasound Intrapartum Procedures: spontaneous vaginal delivery Postpartum Procedures: none Complications-Operative and Postpartum: none Hemoglobin  Date Value Range Status  07/08/2013 12.7  12.0 - 15.0 g/dL Final     HCT  Date Value Range Status  07/08/2013 37.8  36.0 - 46.0 % Final    Physical Exam:  General: alert, cooperative and no distress Lochia: appropriate Uterine Fundus: firm Incision: na DVT Evaluation: No evidence of DVT seen on physical exam.  Discharge Diagnoses: Term Pregnancy-delivered  Discharge Information: Date: 07/10/2013 Activity: pelvic rest Diet: routine Medications: Ibuprofen and Percocet Condition: stable Instructions: refer to practice specific booklet Discharge to: home Follow-up Information   [facility] weeks.   Contact information:   [address] [place] [id number] [phone]      Newborn Data: Live born female  Birth Weight: 6 lb 12.8 oz (3084 g) APGAR: 8, 9  Home with mother.  Markie Frith H 07/10/2013, 1:59 PM

## 2013-07-11 NOTE — Progress Notes (Signed)
Post discharge chart review completed.  

## 2013-07-12 ENCOUNTER — Other Ambulatory Visit: Payer: Self-pay | Admitting: Family Medicine

## 2013-07-12 MED ORDER — CEPHALEXIN 500 MG PO CAPS: 500.0000 mg | ORAL_CAPSULE | Freq: Two times a day (BID) | ORAL | Status: AC

## 2013-07-12 NOTE — Progress Notes (Signed)
Called to assess patient for B/L breast pain and redness worsening gradually since she had her baby few days ago. Pain now is about 7/10 in severity,she uses Ibuprofen with no improvement,she uses warm compression on her breast as well. She breast feed her baby on demand,does not use breast pumping machine since the one she has is not working effectively. Denies any fever,no malaise.  Exam: B/L swollen engorged breast,slightly warm to touch,very tender with mild erythema mostly surrounding her nipples B/L. A/P: Mastitis.        Gradually worsening.        Continue Warm compression,Ibuprofen and frequent breast feeding.        Start Keflex and F/U in 1 wk for reassessment.

## 2013-07-14 ENCOUNTER — Encounter: Payer: Self-pay | Admitting: *Deleted

## 2013-07-21 ENCOUNTER — Ambulatory Visit: Payer: Self-pay | Admitting: Family Medicine

## 2014-04-24 ENCOUNTER — Encounter (HOSPITAL_COMMUNITY): Payer: Self-pay

## 2014-05-25 ENCOUNTER — Encounter: Payer: Self-pay | Admitting: Obstetrics & Gynecology

## 2015-12-04 IMAGING — US US OB COMP +14 WK
1 series · 12 of 28 positions shown · non-contrast
Comparison: none

[Series 1: us ob comp +14 wk · 12 of 99 slices shown]
[im 4/99]
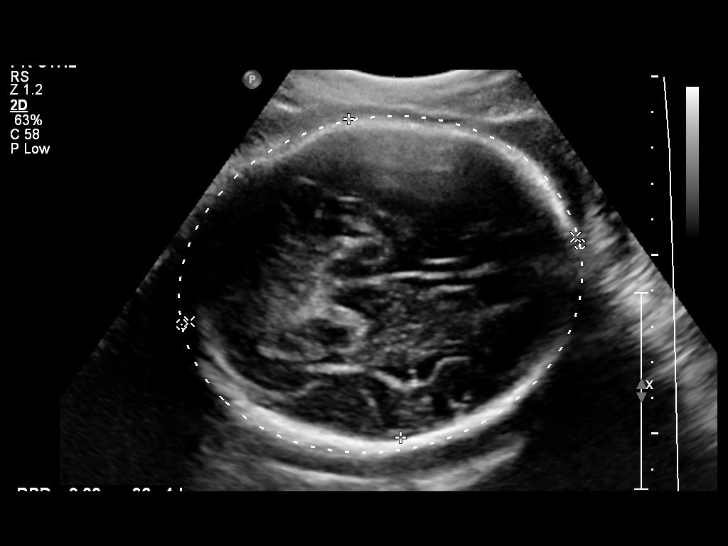
[im 11/99]
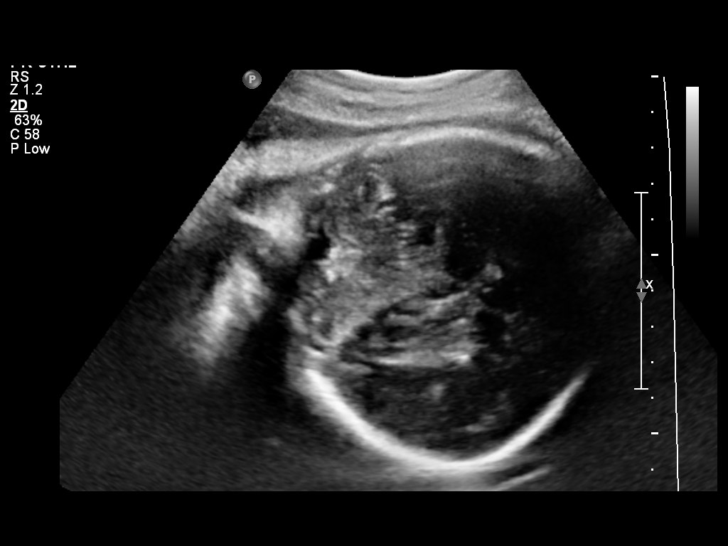
[im 19/99]
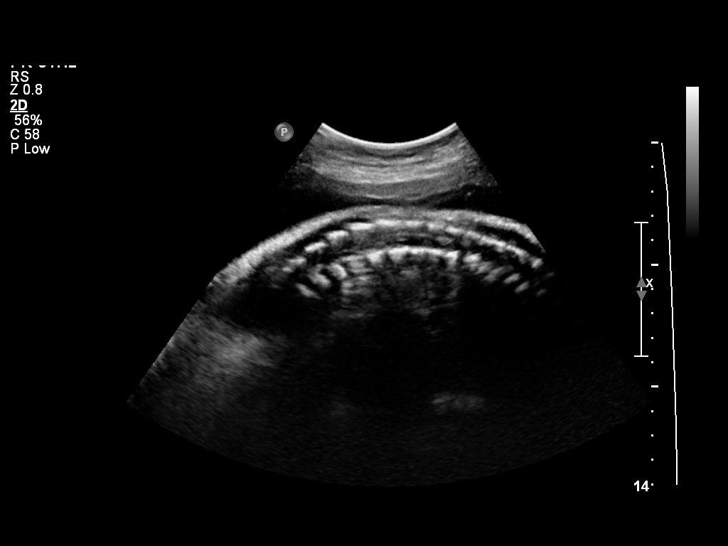
[im 30/99]
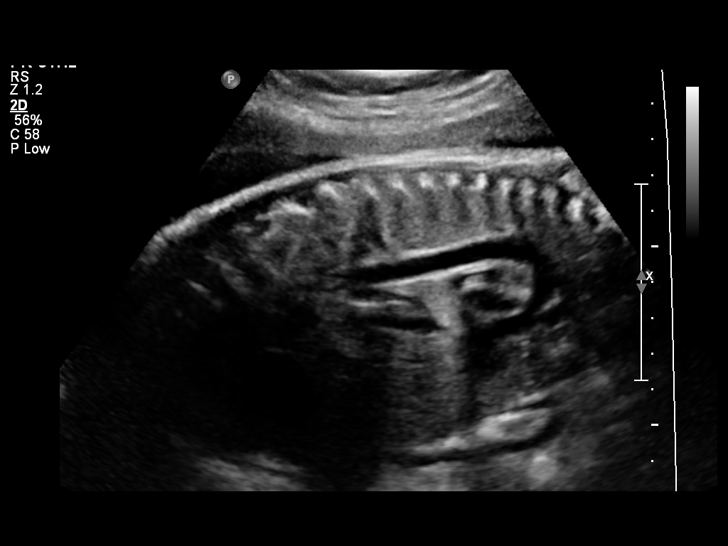
[im 37/99]
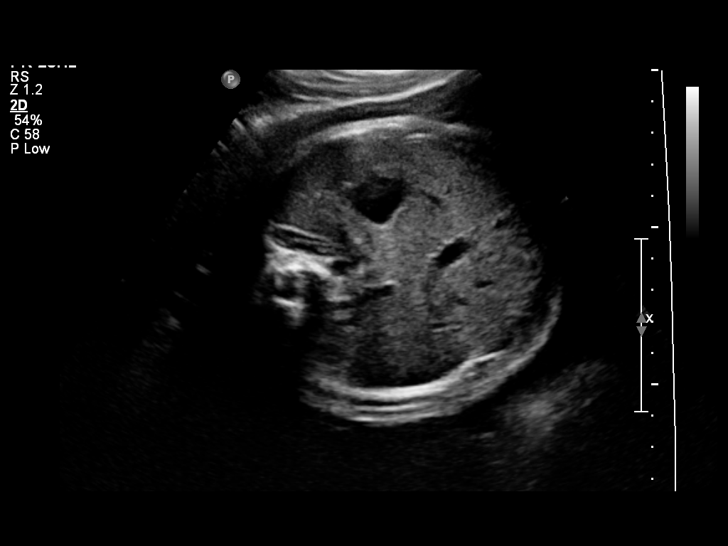
[im 44/99]
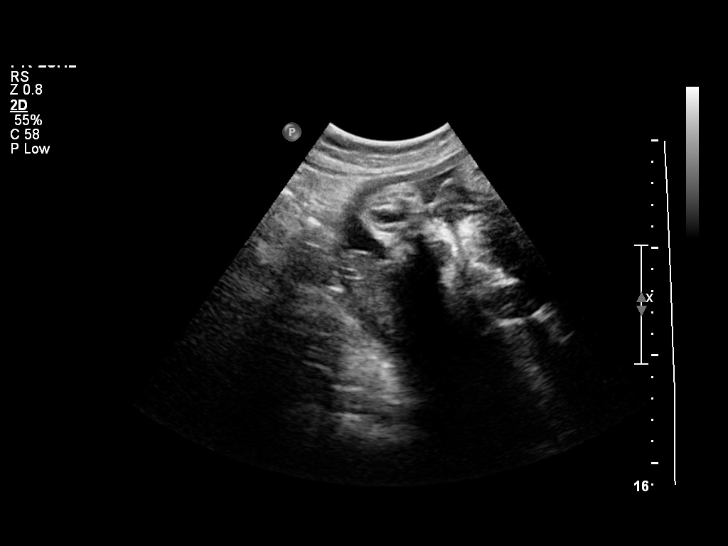
[im 55/99]
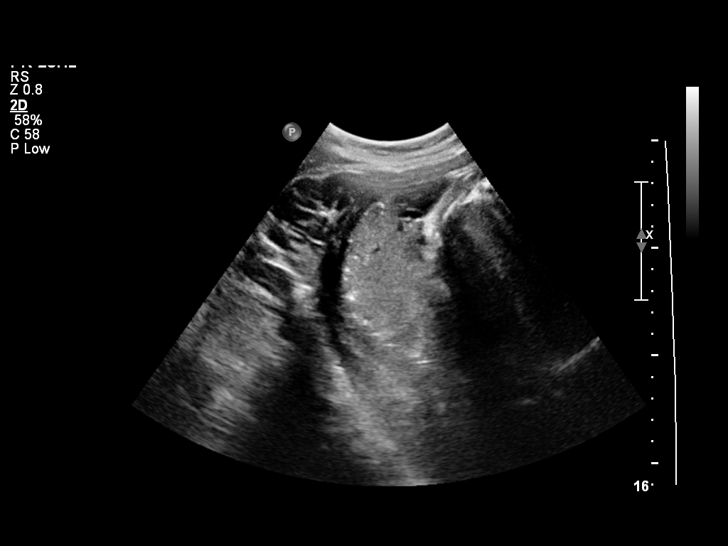
[im 62/99]
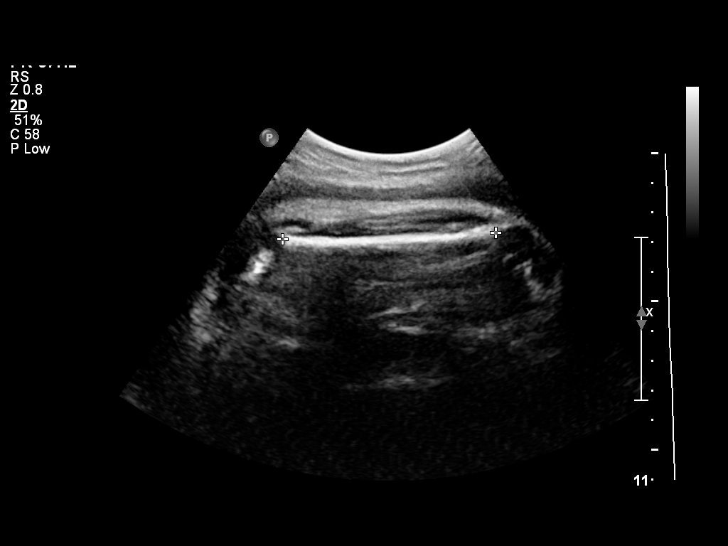
[im 69/99]
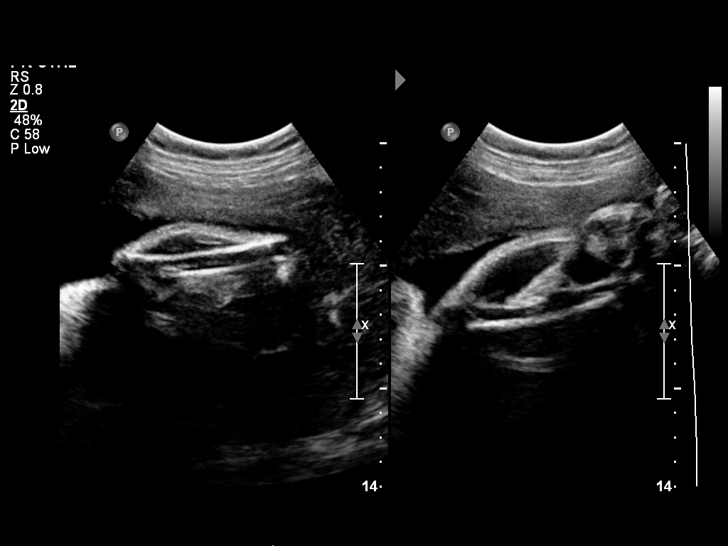
[im 80/99]
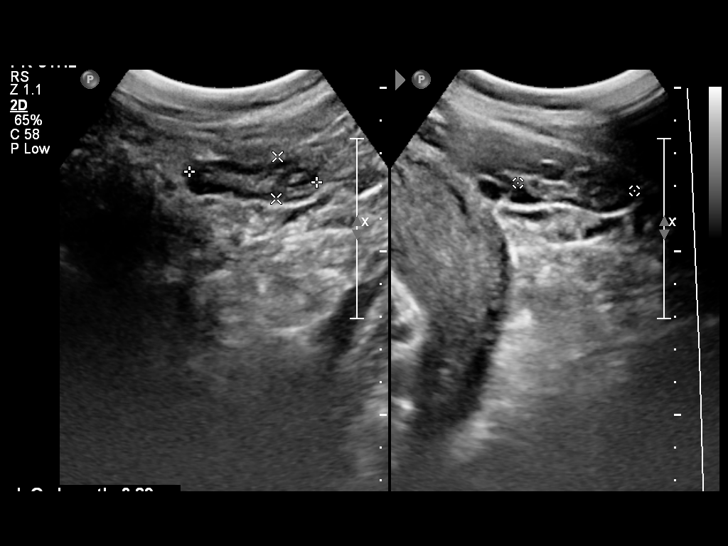
[im 88/99]
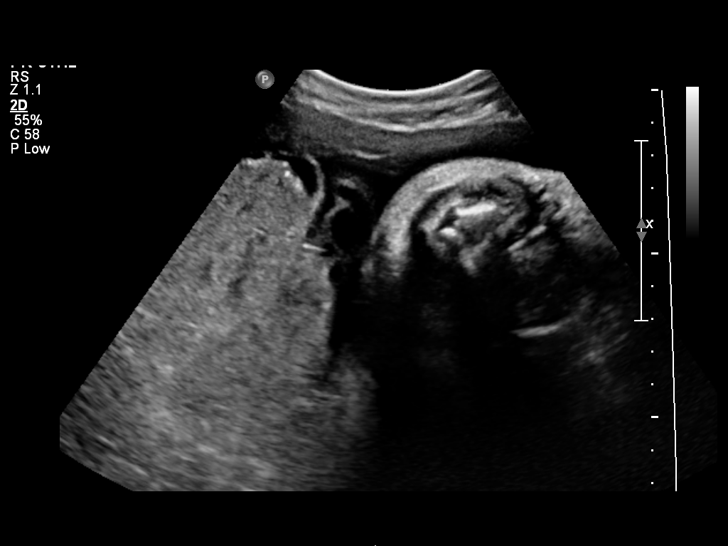
[im 95/99]
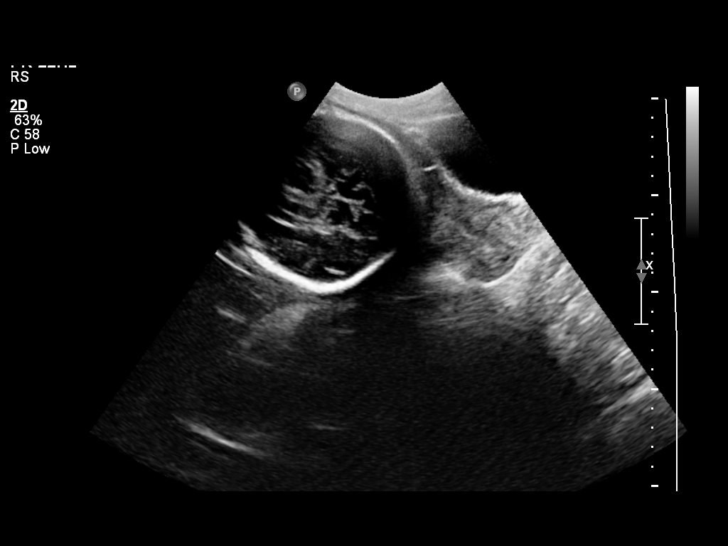

[12 of 28 positions shown; findings below may reference images not displayed]

OBSTETRICS REPORT
                      (Signed Final 06/21/2013 [DATE])

Service(s) Provided

 US OB COMP + 14 WK                                    76805.1
Indications

 Basic anatomic survey
 No or Little Prenatal Care
Fetal Evaluation

 Num Of Fetuses:    1
 Preg. Location:    Intrauterine
 Fetal Heart Rate:  161                          bpm
 Cardiac Activity:  Observed
 Fetal Lie:         Intrauterine
 Presentation:      Cephalic
 Placenta:          Posterior Right Lat.,
                    above cervical os
 P. Cord            Visualized, central
 Insertion:

 Amniotic Fluid
 AFI FV:      Subjectively within normal limits
 AFI Sum:     16.04   cm       64  %Tile     Larg Pckt:    5.16  cm
 RUQ:   2.92    cm   RLQ:    3.43   cm    LUQ:   4.53    cm   LLQ:    5.16   cm
Biometry

 BPD:     90.2  mm     G. Age:  36w 4d                CI:         81.8   70 - 86
 OFD:    110.3  mm                                    FL/HC:      21.8   20.6 -

 HC:       327  mm     G. Age:  37w 1d        9  %    HC/AC:      1.03   0.87 -

 AC:     317.4  mm     G. Age:  35w 4d        6  %    FL/BPD:     78.9   71 - 87
 FL:      71.2  mm     G. Age:  36w 4d       10  %    FL/AC:      22.4   20 - 24
 HUM:     63.8  mm     G. Age:  37w 0d       46  %

 Est. FW:    3429  gm      6 lb 5 oz     26  %
Gestational Age

 LMP:           38w 4d        Date:  09/24/12                 EDD:   07/01/13
 U/S Today:     36w 3d                                        EDD:   07/16/13
 Best:          38w 4d     Det. By:  LMP  (09/24/12)          EDD:   07/01/13
Anatomy

 Cranium:          Appears normal         Aortic Arch:      Appears normal
 Fetal Cavum:      Appears normal         Ductal Arch:      Appears normal
 Ventricles:       Appears normal         Diaphragm:        Appears normal
 Choroid Plexus:   Appears normal         Stomach:          Appears normal, left
                                                            sided
 Cerebellum:       Appears normal         Abdomen:          Appears normal
 Posterior Fossa:  Appears normal         Abdominal Wall:   Appears nml (cord
                                                            insert, abd wall)
 Nuchal Fold:      Not applicable (>20    Cord Vessels:     Appears normal (3
                   wks GA)                                  vessel cord)
 Face:             Appears normal         Kidneys:          Appear normal
                   (orbits and profile)
 Lips:             Appears normal         Bladder:          Appears normal
 Heart:            Appears normal         Spine:            Appears normal
                   (4CH, axis, and
                   situs)
 RVOT:             Appears normal         Lower             Appears normal
                                          Extremities:
 LVOT:             Appears normal         Upper             Appears normal
                                          Extremities:

 Other:  Technically difficult due to advanced GA and fetal position. Fetus
         appears to be a male.
Targeted Anatomy

 Fetal Central Nervous System
 Lat. Ventricles:
Cervix Uterus Adnexa

 Cervical Length:    3.54     cm

 Cervix:       Normal appearance by transabdominal scan.
 Uterus:       No abnormality visualized.
 Cul De Sac:   No free fluid seen.
 Left Ovary:    Size(cm) L: 3.89 x W: 3.56 x H: 1.28  Volume(cc):
 Right Ovary:   Size(cm) L: 2.99 x W: 2.08 x H: 1.77  Volume(cc):

 Adnexa:     No adnexal mass visualized.
Impression

 Single IUP at 38 [DATE] weeks
 Normal fetal anatomic survey, although somewhat limited due
 to late gestational age
 No fetal anomalies noted
 Fetal growth is appropriate (26th %tile)
 Normal amniotic fluid volume
Recommendations

 Follow-up ultrasounds as clinically indicated.

 questions or concerns.
